# Patient Record
Sex: Female | Born: 1975 | Race: Black or African American | Hispanic: No | Marital: Single | Smoking: Current every day smoker
Health system: Southern US, Community
[De-identification: ages and names within clinical notes are randomized; demographics above are authoritative.]

## PROBLEM LIST (undated history)

## (undated) DIAGNOSIS — I8290 Acute embolism and thrombosis of unspecified vein: Secondary | ICD-10-CM

## (undated) HISTORY — PX: UNILATERAL SALPINGECTOMY: SHX6160

## (undated) HISTORY — PX: TUBAL LIGATION: SHX77

## (undated) HISTORY — DX: Acute embolism and thrombosis of unspecified vein: I82.90

---

## 1997-03-07 HISTORY — PX: KNEE SURGERY: SHX244

## 2006-10-01 ENCOUNTER — Emergency Department: Payer: Self-pay | Admitting: Emergency Medicine

## 2006-10-02 ENCOUNTER — Ambulatory Visit: Payer: Self-pay | Admitting: Unknown Physician Specialty

## 2011-11-19 ENCOUNTER — Ambulatory Visit: Payer: Self-pay

## 2017-12-14 ENCOUNTER — Other Ambulatory Visit: Payer: Self-pay

## 2017-12-14 ENCOUNTER — Emergency Department
Admission: EM | Admit: 2017-12-14 | Discharge: 2017-12-14 | Disposition: A | Discharge status: Home / Self Care | Payer: Self-pay | Attending: Emergency Medicine | Admitting: Emergency Medicine

## 2017-12-14 DIAGNOSIS — F17210 Nicotine dependence, cigarettes, uncomplicated: Secondary | ICD-10-CM | POA: Insufficient documentation

## 2017-12-14 DIAGNOSIS — R102 Pelvic and perineal pain: Secondary | ICD-10-CM

## 2017-12-14 DIAGNOSIS — N3001 Acute cystitis with hematuria: Secondary | ICD-10-CM | POA: Insufficient documentation

## 2017-12-14 LAB — URINALYSIS, COMPLETE (UACMP) WITH MICROSCOPIC
BILIRUBIN URINE: NEGATIVE
Glucose, UA: NEGATIVE mg/dL
Hgb urine dipstick: NEGATIVE
KETONES UR: 20 mg/dL — AB
Leukocytes, UA: NEGATIVE
Nitrite: POSITIVE — AB
PH: 6 (ref 5.0–8.0)
Protein, ur: NEGATIVE mg/dL
Specific Gravity, Urine: 1.021 (ref 1.005–1.030)

## 2017-12-14 LAB — CBC
HEMATOCRIT: 41.2 % (ref 36.0–46.0)
HEMOGLOBIN: 13.2 g/dL (ref 12.0–15.0)
MCH: 28.8 pg (ref 26.0–34.0)
MCHC: 32 g/dL (ref 30.0–36.0)
MCV: 90 fL (ref 80.0–100.0)
Platelets: 182 10*3/uL (ref 150–400)
RBC: 4.58 MIL/uL (ref 3.87–5.11)
RDW: 13.3 % (ref 11.5–15.5)
WBC: 10.2 10*3/uL (ref 4.0–10.5)
nRBC: 0 % (ref 0.0–0.2)

## 2017-12-14 LAB — POCT PREGNANCY, URINE: PREG TEST UR: NEGATIVE

## 2017-12-14 MED ORDER — KETOROLAC TROMETHAMINE 30 MG/ML IJ SOLN: 30.0000 mg | Freq: Once | INTRAMUSCULAR | Status: DC

## 2017-12-14 MED ORDER — KETOROLAC TROMETHAMINE 30 MG/ML IJ SOLN
15.0000 mg | Freq: Once | INTRAMUSCULAR | Status: DC
  Filled 2017-12-14: qty 1

## 2017-12-14 MED ORDER — NITROFURANTOIN MONOHYD MACRO 100 MG PO CAPS: 100.0000 mg | ORAL_CAPSULE | Freq: Once | ORAL | Status: DC

## 2017-12-14 MED ORDER — KETOROLAC TROMETHAMINE 30 MG/ML IJ SOLN
15.0000 mg | Freq: Once | INTRAMUSCULAR | Status: AC
  Administered 2017-12-14: 15 mg via INTRAMUSCULAR

## 2017-12-14 MED ORDER — NITROFURANTOIN MONOHYD MACRO 100 MG PO CAPS
100.0000 mg | ORAL_CAPSULE | Freq: Once | ORAL | Status: AC
  Administered 2017-12-14: 100 mg via ORAL
  Filled 2017-12-14: qty 1

## 2017-12-14 MED ORDER — KETOROLAC TROMETHAMINE 60 MG/2ML IM SOLN: 60.0000 mg | Freq: Once | INTRAMUSCULAR | Status: DC

## 2017-12-14 MED ORDER — NITROFURANTOIN MONOHYD MACRO 100 MG PO CAPS: 100.0000 mg | ORAL_CAPSULE | Freq: Two times a day (BID) | ORAL | 0 refills | Status: AC

## 2017-12-14 NOTE — ED Provider Notes (Signed)
[facility] Emergency Department Provider Note  ____________________________________________  Time seen: Approximately 7:02 PM  I have reviewed the triage vital signs and the nursing notes.   HISTORY  Chief Complaint Abdominal Pain   HPI Jill Blair is a 42 y.o. female no significant past medical history who presents for evaluation of abdominal pain.  Patient reports that a week ago she had several days of nausea, vomiting, diarrhea and abdominal pain.  Those symptoms resolved.  Over the last few days she has had sharp suprapubic pain that she describes as similar to uterine contractions, intermittent and associated with pressure with urination.  She describes decreased urination but has not been drinking lots of fluid.  She denies flank pain, fever, but has had chills. No nausea or vomiting, no diarrhea, no vaginal discharge.  Patient has had a tubal ligation but no other abdominal surgeries  PMH None - reviewed  Allergies Patient has no allergy information on record.    Social History Social History   Tobacco Use  . Smoking status: Current Every Day Smoker    Packs/day: 0.50    Types: Cigarettes  . Smokeless tobacco: Never Used  Substance Use Topics  . Alcohol use: Yes    Comment: occasionally  . Drug use: Never    Review of Systems  Constitutional: Negative for fever. Eyes: Negative for visual changes. ENT: Negative for sore throat. Neck: No neck pain  Cardiovascular: Negative for chest pain. Respiratory: Negative for shortness of breath. Gastrointestinal: + suprapubic abdominal pain. No vomiting or diarrhea. Genitourinary: Negative for dysuria. Musculoskeletal: Negative for back pain. Skin: Negative for rash. Neurological: Negative for headaches, weakness or numbness. Psych: No SI or HI  ____________________________________________   PHYSICAL EXAM:  VITAL SIGNS: ED Triage Vitals  Enc Vitals Group     BP 12/14/17 1530  121/87     Pulse Rate 12/14/17 1530 76     Resp 12/14/17 1530 16     Temp 12/14/17 1530 99.2 F (37.3 C)     Temp Source 12/14/17 1530 Oral     SpO2 12/14/17 1530 98 %     Weight 12/14/17 1534 277 lb (125.6 kg)     Height 12/14/17 1530 5\' 4"  (1.626 m)     Head Circumference --      Peak Flow --      Pain Score 12/14/17 1534 9     Pain Loc --      Pain Edu? --      Excl. in GC? --     Constitutional: Alert and oriented. Well appearing and in no apparent distress. HEENT:      Head: Normocephalic and atraumatic.         Eyes: Conjunctivae are normal. Sclera is non-icteric.       Mouth/Throat: Mucous membranes are moist.       Neck: Supple with no signs of meningismus. Cardiovascular: Regular rate and rhythm. No murmurs, gallops, or rubs. 2+ symmetrical distal pulses are present in all extremities. No JVD. Respiratory: Normal respiratory effort. Lungs are clear to auscultation bilaterally. No wheezes, crackles, or rhonchi.  Gastrointestinal: Soft, tender to palpation over the suprapubic region, and non distended with positive bowel sounds. No rebound or guarding. Genitourinary: No CVA tenderness. Musculoskeletal: Nontender with normal range of motion in all extremities. No edema, cyanosis, or erythema of extremities. Neurologic: Normal speech and language. Face is symmetric. Moving all extremities. No gross focal neurologic deficits are appreciated. Skin: Skin is warm, dry and  intact. No rash noted. Psychiatric: Mood and affect are normal. Speech and behavior are normal.  ____________________________________________   LABS (all labs ordered are listed, but only abnormal results are displayed)  Labs Reviewed  URINALYSIS, COMPLETE (UACMP) WITH MICROSCOPIC - Abnormal; Notable for the following components:      Result Value   Color, Urine YELLOW (*)    APPearance HAZY (*)    Ketones, ur 20 (*)    Nitrite POSITIVE (*)    Bacteria, UA MANY (*)    All other components within normal  limits  URINE CULTURE  CBC  COMPREHENSIVE METABOLIC PANEL  LIPASE, BLOOD  POC URINE PREG, ED  POCT PREGNANCY, URINE   ____________________________________________  EKG  none  ____________________________________________  RADIOLOGY  none  ____________________________________________   PROCEDURES  Procedure(s) performed: None Procedures Critical Care performed:  None ____________________________________________   INITIAL IMPRESSION / ASSESSMENT AND PLAN / ED COURSE  42 y.o. female no significant past medical history who presents for evaluation of suprapubic abdominal pain for several days.  Patient is well-appearing and in no distress, she has normal vital signs, temp of 99.2 in triage but repeat during my exam 98.28F.  Abdomen is soft with tenderness palpation of the suprapubic region.  UA positive for UTI.  Labs pending.  No flank pain or signs of sepsis or pyelonephritis at this time. Clinical Course as of Dec 14 2029  Thu Dec 14, 2017  2029 CBC with no leukocytosis.  CMP was clotted and I recommended repeating however patient wants to go home since she is been here for 5 hours and she has 2 kids at home.  Recommend close follow-up with primary care doctor for recheck of her labs.  Discussed return precautions for pyelonephritis and sepsis.  Patient will be discharged home on Macrobid.   [CV]    Clinical Course User Index [CV] Don Perking [place], MD     As part of my medical decision making, I reviewed the following data within the electronic MEDICAL RECORD NUMBER Nursing notes reviewed and incorporated, Labs reviewed , Old chart reviewed, Notes from prior ED visits and [place] Controlled Substance Database    Pertinent labs & imaging results that were available during my care of the patient were reviewed by me and considered in my medical decision making (see chart for details).    ____________________________________________   FINAL CLINICAL IMPRESSION(S) / ED  DIAGNOSES  Final diagnoses:  Acute cystitis with hematuria  Suprapubic abdominal pain      NEW MEDICATIONS STARTED DURING THIS VISIT:  ED Discharge Orders    None       Note:  This document was prepared using Dragon voice recognition software and may include unintentional dictation errors.    Nita Sickle, MD 12/14/17 2031

## 2017-12-14 NOTE — Discharge Instructions (Addendum)

## 2017-12-14 NOTE — ED Notes (Addendum)
This RN attempted IV without success. Pt requesting pain medication as an IM shot instead of a second IV attempt. MD made aware.

## 2017-12-14 NOTE — ED Triage Notes (Signed)
Pt to the er for lower abd pain and pressure. Pt reports less urination.

## 2017-12-16 LAB — URINE CULTURE

## 2019-08-29 ENCOUNTER — Encounter: Payer: Self-pay | Admitting: Obstetrics and Gynecology

## 2019-08-29 ENCOUNTER — Other Ambulatory Visit: Payer: Self-pay

## 2019-08-29 ENCOUNTER — Encounter: Payer: Self-pay | Admitting: Radiology

## 2019-08-29 ENCOUNTER — Ambulatory Visit (INDEPENDENT_AMBULATORY_CARE_PROVIDER_SITE_OTHER): Payer: Managed Care, Other (non HMO) | Admitting: Obstetrics and Gynecology

## 2019-08-29 VITALS — BP 132/88 | HR 72 | Wt 278.8 lb

## 2019-08-29 DIAGNOSIS — Z3009 Encounter for other general counseling and advice on contraception: Secondary | ICD-10-CM | POA: Diagnosis not present

## 2019-08-29 DIAGNOSIS — R7989 Other specified abnormal findings of blood chemistry: Secondary | ICD-10-CM

## 2019-08-29 DIAGNOSIS — R030 Elevated blood-pressure reading, without diagnosis of hypertension: Secondary | ICD-10-CM

## 2019-08-29 DIAGNOSIS — Z01419 Encounter for gynecological examination (general) (routine) without abnormal findings: Secondary | ICD-10-CM

## 2019-08-29 DIAGNOSIS — Z8759 Personal history of other complications of pregnancy, childbirth and the puerperium: Secondary | ICD-10-CM | POA: Diagnosis not present

## 2019-08-29 DIAGNOSIS — Z6841 Body Mass Index (BMI) 40.0 and over, adult: Secondary | ICD-10-CM | POA: Diagnosis not present

## 2019-08-29 DIAGNOSIS — Z86718 Personal history of other venous thrombosis and embolism: Secondary | ICD-10-CM

## 2019-08-29 DIAGNOSIS — Z720 Tobacco use: Secondary | ICD-10-CM | POA: Insufficient documentation

## 2019-08-29 NOTE — Progress Notes (Signed)
Patient reports having abnormal periods for the past couple months. Pap today 2017 STI-testing  Hiv and RPR  Mother breast cancer over 59

## 2019-08-29 NOTE — Progress Notes (Signed)
Obstetrics and Gynecology Annual Patient Evaluation  Appointment Date: 08/29/2019  OBGYN Clinic: [facility] [place]  Primary Care Provider: None  Chief Complaint:  Chief Complaint  Patient presents with  . Gynecologic Exam    History of Present Illness: Jill Blair is a 44 y.o. African-American [phone] (Patient's last menstrual period was 08/21/2019.), seen for the above chief complaint.  Patient with periods that are bordering on q3wks for the past few month, still approximately a week. No climateric s/s.     Review of Systems: As Per HPI otherwise negative  Patient Active Problem List   Diagnosis Date Noted  . History of ectopic pregnancy 08/29/2019  . Elevated blood pressure reading 08/29/2019  . BMI 40.0-44.9, adult ([place]) 08/29/2019    Past Medical History:  Past Medical History:  Diagnosis Date  . Blood clot in vein    estrogen related?     Past Surgical History:  Past Surgical History:  Procedure Laterality Date  . KNEE SURGERY  1999  . TUBAL LIGATION     failed. pt with subsequent left tubal pregnancy in early 2010s  . UNILATERAL SALPINGECTOMY Right    ectopic pregnacy    Past Obstetrical History:  OB History  Gravida Para Term Preterm AB Living  5 3 2   2 2   SAB TAB Ectopic Multiple Live Births      2   2    # Outcome Date GA Lbr Len/2nd Weight Sex Delivery Anes PTL Lv  5 Term 12/31/08     Vag-Spont  N LIV  4 Para 05/06/02     Vag-Spont  N   3 Term           2 Ectopic           1 Ectopic             Obstetric Comments  G1: right ectopic   G2 and G3: SVDs  Left tubal ligation  G4: left tubal pregnancy. Pt states treated w/o surgery or medications   Past Gynecological History: As per HPI.  Social History:  Social History   Socioeconomic History  . Marital status: Single    Spouse name: Not on file  . Number of children: Not on file  . Years of education: Not on file  . Highest education level: Not on file   Occupational History  . Not on file  Tobacco Use  . Smoking status: Current Every Day Smoker    Packs/day: 0.50    Types: Cigarettes  . Smokeless tobacco: Never Used  Substance and Sexual Activity  . Alcohol use: Yes    Comment: occasionally  . Drug use: Never  . Sexual activity: Yes    Birth control/protection: Surgical  Other Topics Concern  . Not on file  Social History Narrative  . Not on file   Social Determinants of Health   Financial Resource Strain:   . Difficulty of Paying Living Expenses:   Food Insecurity:   . Worried About Charity fundraiser in the Last Year:   . Arboriculturist in the Last Year:   Transportation Needs:   . Film/video editor (Medical):   Marland Kitchen Lack of Transportation (Non-Medical):   Physical Activity:   . Days of Exercise per Week:   . Minutes of Exercise per Session:   Stress:   . Feeling of Stress :   Social Connections:   . Frequency of Communication with Friends and Family:   .  Frequency of Social Gatherings with Friends and Family:   . Attends Religious Services:   . Active Member of Clubs or Organizations:   . Attends Banker Meetings:   Marland Kitchen Marital Status:   Intimate Partner Violence:   . Fear of Current or Ex-Partner:   . Emotionally Abused:   Marland Kitchen Physically Abused:   . Sexually Abused:     Family History:  Family History  Problem Relation Age of Onset  . Cancer Mother   . Cancer Father    Mother with Breast cancer in her 74s Father with skin cancer  Medications [name] had no medications administered during this visit. No current outpatient medications on file.   No current facility-administered medications for this visit.    Allergies Patient has no known allergies.   Physical Exam:  BP 132/88   Pulse 72   Wt 278 lb 12.8 oz (126.5 kg)   LMP 08/21/2019 Comment: Tubal ligation 2011  BMI 47.86 kg/m  Body mass index is 47.86 kg/m. General appearance: Well nourished, well developed  female in no acute distress.  Neck:  Supple, normal appearance, and no thyromegaly  Cardiovascular: normal s1 and s2.  No murmurs, rubs or gallops. Respiratory:  Clear to auscultation bilateral. Normal respiratory effort Abdomen: positive bowel sounds and no masses, hernias; diffusely non tender to palpation, non distended Breasts: breasts appear normal, no suspicious masses, no skin or nipple changes or axillary nodes, and normal palpation. Neuro/Psych:  Normal mood and affect.  Skin:  Warm and dry.  Lymphatic:  No inguinal lymphadenopathy.   Pelvic exam: is not limited by body habitus EGBUS: within normal limits Vagina: within normal limits and with no blood or discharge in the vault Cervix: normal appearing cervix without tenderness, discharge or lesions. Uterus:  nonenlarged and non tender Adnexa:  normal adnexa and no mass, fullness, tenderness Rectovaginal: deferred  Laboratory: none  Radiology: none  Assessment: pt stable  Plan:  1. Well woman exam with routine gynecological exam Shortened cycle likely age related. Will check basic labs as patient does not have a pcp. List given to patient. I told her that since she got pregnant after having a tubal that I wouldn't consider it effective anymore, even though it's been approx. Ten years. Pt declines additional intervention for this.  - HIV Antibody (routine testing w rflx) - RPR - Hepatitis C Antibody - MM 3D SCREEN BREAST BILATERAL; Future - Lipid panel - Hemoglobin A1c - TSH - Comprehensive metabolic panel - CBC - Hepatitis B Surface AntiGEN - VITAMIN D 25 Hydroxy (Vit-D Deficiency, Fractures) - Chlamydia/Gonococcus/Trichomonas, NAA - IGP, Aptima HPV  2. History of ectopic pregnancy  3. BMI 40.0-44.9, adult ([facility])  4. Elevated blood pressure reading D/w her will need to follow this up at next visits.   Orders Placed This Encounter  Procedures  . Chlamydia/Gonococcus/Trichomonas, NAA  . MM 3D SCREEN BREAST  BILATERAL  . HIV Antibody (routine testing w rflx)  . RPR  . Hepatitis C Antibody  . Lipid panel  . Hemoglobin A1c  . TSH  . Comprehensive metabolic panel  . CBC  . Hepatitis B Surface AntiGEN  . VITAMIN D 25 Hydroxy (Vit-D Deficiency, Fractures)    RTC PRN  Cornelia Copa MD Attending Center [facility])

## 2019-08-30 LAB — HEPATITIS C ANTIBODY: Hep C Virus Ab: <0.1 s/co ratio (ref 0.0–0.9)

## 2019-08-30 LAB — LIPID PANEL
Chol/HDL Ratio: 4.8 ratio — ABNORMAL HIGH (ref 0.0–4.4)
Cholesterol, Total: 181 mg/dL (ref 100–199)
HDL: 38 mg/dL — ABNORMAL LOW (ref >39)
LDL Chol Calc (NIH): 119 mg/dL — ABNORMAL HIGH (ref 0–99)
Triglycerides: 131 mg/dL (ref 0–149)
VLDL Cholesterol Cal: 24 mg/dL (ref 5–40)

## 2019-08-30 LAB — TSH: TSH: 2.72 u[IU]/mL (ref 0.450–4.500)

## 2019-08-30 LAB — COMPREHENSIVE METABOLIC PANEL
ALT: 8 IU/L (ref 0–32)
AST: 13 IU/L (ref 0–40)
Albumin/Globulin Ratio: 1.3 (ref 1.2–2.2)
Albumin: 4.2 g/dL (ref 3.8–4.8)
Alkaline Phosphatase: 78 IU/L (ref 48–121)
BUN/Creatinine Ratio: 11 (ref 9–23)
BUN: 11 mg/dL (ref 6–24)
Bilirubin Total: 0.4 mg/dL (ref 0.0–1.2)
CO2: 23 mmol/L (ref 20–29)
Calcium: 8.9 mg/dL (ref 8.7–10.2)
Chloride: 103 mmol/L (ref 96–106)
Creatinine, Ser: 1.02 mg/dL — ABNORMAL HIGH (ref 0.57–1.00)
GFR calc Af Amer: 79 mL/min/{1.73_m2} (ref >59)
GFR calc non Af Amer: 68 mL/min/{1.73_m2} (ref >59)
Globulin, Total: 3.3 g/dL (ref 1.5–4.5)
Glucose: 91 mg/dL (ref 65–99)
Potassium: 4.3 mmol/L (ref 3.5–5.2)
Sodium: 136 mmol/L (ref 134–144)
Total Protein: 7.5 g/dL (ref 6.0–8.5)

## 2019-08-30 LAB — CBC
Hematocrit: 42.3 % (ref 34.0–46.6)
Hemoglobin: 13.4 g/dL (ref 11.1–15.9)
MCH: 29.3 pg (ref 26.6–33.0)
MCHC: 31.7 g/dL (ref 31.5–35.7)
MCV: 92 fL (ref 79–97)
Platelets: 359 10*3/uL (ref 150–450)
RBC: 4.58 x10E6/uL (ref 3.77–5.28)
RDW: 12.3 % (ref 11.7–15.4)
WBC: 7.5 10*3/uL (ref 3.4–10.8)

## 2019-08-30 LAB — VITAMIN D 25 HYDROXY (VIT D DEFICIENCY, FRACTURES): Vit D, 25-Hydroxy: 7.9 ng/mL — ABNORMAL LOW (ref 30.0–100.0)

## 2019-08-30 LAB — RPR: RPR Ser Ql: NONREACTIVE

## 2019-08-30 LAB — HEMOGLOBIN A1C
Est. average glucose Bld gHb Est-mCnc: 85 mg/dL
Hgb A1c MFr Bld: 4.6 % — ABNORMAL LOW (ref 4.8–5.6)

## 2019-08-30 LAB — HEPATITIS B SURFACE ANTIGEN: Hepatitis B Surface Ag: NEGATIVE

## 2019-08-30 LAB — HIV ANTIBODY (ROUTINE TESTING W REFLEX): HIV Screen 4th Generation wRfx: NONREACTIVE

## 2019-09-01 LAB — IGP, APTIMA HPV
HPV Aptima: NEGATIVE
PAP Smear Comment: 0

## 2019-09-02 ENCOUNTER — Telehealth: Payer: Self-pay | Admitting: *Deleted

## 2019-09-02 DIAGNOSIS — Z309 Encounter for contraceptive management, unspecified: Secondary | ICD-10-CM | POA: Insufficient documentation

## 2019-09-02 DIAGNOSIS — R7989 Other specified abnormal findings of blood chemistry: Secondary | ICD-10-CM | POA: Insufficient documentation

## 2019-09-02 LAB — CHLAMYDIA/GONOCOCCUS/TRICHOMONAS, NAA
Chlamydia by NAA: NEGATIVE
Gonococcus by NAA: NEGATIVE
Trich vag by NAA: NEGATIVE

## 2019-09-02 MED ORDER — VITAMIN D (ERGOCALCIFEROL) 1.25 MG (50000 UNIT) PO CAPS: 50000.0000 [IU] | ORAL_CAPSULE | ORAL | 0 refills | Status: AC

## 2019-09-02 NOTE — Telephone Encounter (Signed)
Pt informed of lab results and recommendations from Dr Vergie Living

## 2019-09-02 NOTE — Addendum Note (Signed)
Addended by: [place] Bing on: 09/02/2019 10:29 AM   Modules accepted: Orders

## 2019-09-02 NOTE — Telephone Encounter (Signed)
-----   Message from [place] Bing, MD sent at 09/02/2019 10:28 AM EDT ----- Can you let her know everything came back fine except your vitamin d is low, her cholesterol is a little high and her Cr is borderline high. I sent in pills for the low vitamin d, I recommend diet and exercise for her cholesterol and I recommend rechecking her Cr in 3 months. We also gave her a list of PCPs and remind her to call and establish care with one of them. Thanks!

## 2019-09-12 ENCOUNTER — Ambulatory Visit
Admission: RE | Admit: 2019-09-12 | Discharge: 2019-09-12 | Disposition: A | Discharge status: Home / Self Care | Payer: Managed Care, Other (non HMO) | Source: Ambulatory Visit | Attending: Obstetrics and Gynecology | Admitting: Obstetrics and Gynecology

## 2019-09-12 ENCOUNTER — Other Ambulatory Visit: Payer: Self-pay | Admitting: Obstetrics and Gynecology

## 2019-09-12 ENCOUNTER — Other Ambulatory Visit: Payer: Self-pay

## 2019-09-12 DIAGNOSIS — Z01419 Encounter for gynecological examination (general) (routine) without abnormal findings: Secondary | ICD-10-CM

## 2019-09-12 DIAGNOSIS — Z1231 Encounter for screening mammogram for malignant neoplasm of breast: Secondary | ICD-10-CM

## 2019-09-12 DIAGNOSIS — N63 Unspecified lump in unspecified breast: Secondary | ICD-10-CM

## 2019-09-12 NOTE — Addendum Note (Signed)
Addended by: [place] Bing on: 09/12/2019 10:08 AM   Modules accepted: Orders

## 2019-09-13 LAB — URINALYSIS, ROUTINE W REFLEX MICROSCOPIC
Bilirubin, UA: NEGATIVE
Glucose, UA: NEGATIVE
Ketones, UA: NEGATIVE
Leukocytes,UA: NEGATIVE
Nitrite, UA: NEGATIVE
Protein,UA: NEGATIVE
RBC, UA: NEGATIVE
Specific Gravity, UA: 1.024 (ref 1.005–1.030)
Urobilinogen, Ur: 0.2 mg/dL (ref 0.2–1.0)
pH, UA: 6 (ref 5.0–7.5)

## 2021-01-22 IMAGING — MG DIGITAL SCREENING BILAT W/ TOMO W/ CAD
8 of 19 series · 8 of 40 positions shown · non-contrast
Comparison: None.

CLINICAL DATA: Screening.

EXAM:
DIGITAL SCREENING BILATERAL MAMMOGRAM WITH TOMO AND CAD

[L MLO synth-2D (1 of 2)]
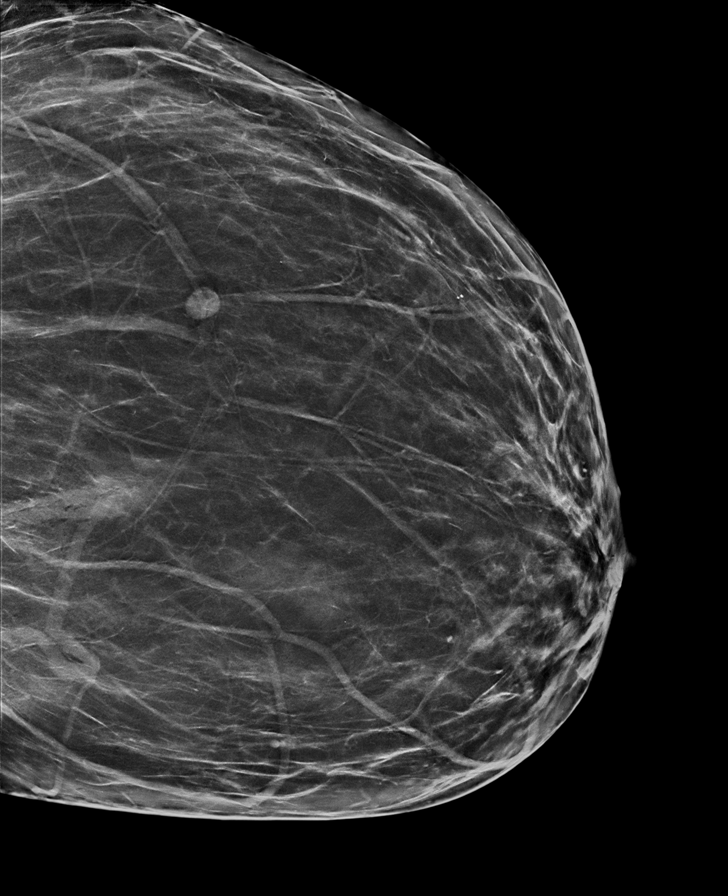

[R MLO synth-2D (1 of 2)]
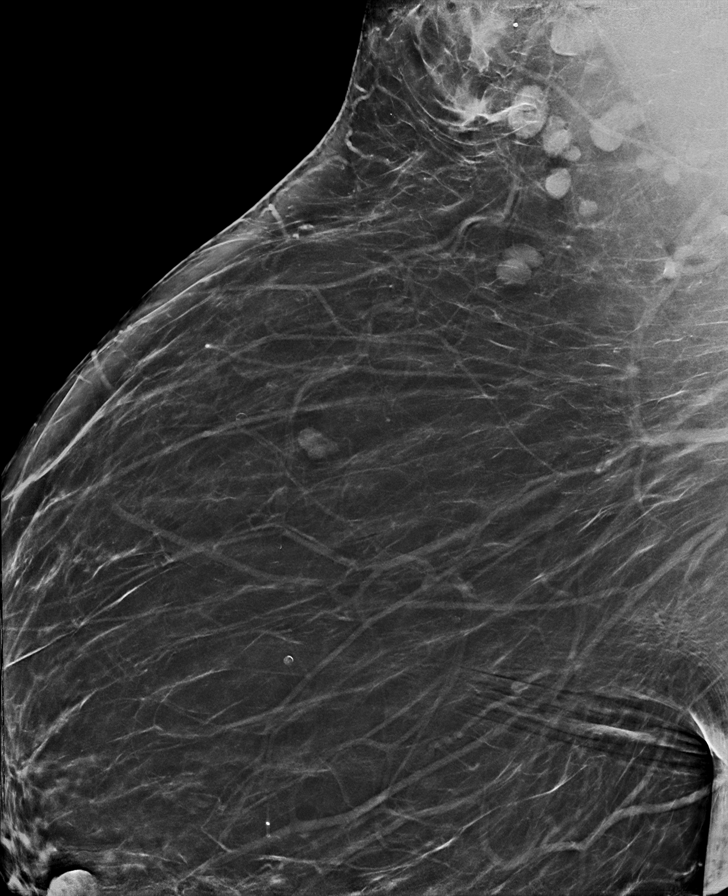

[L MLO synth-2D (2 of 2)]
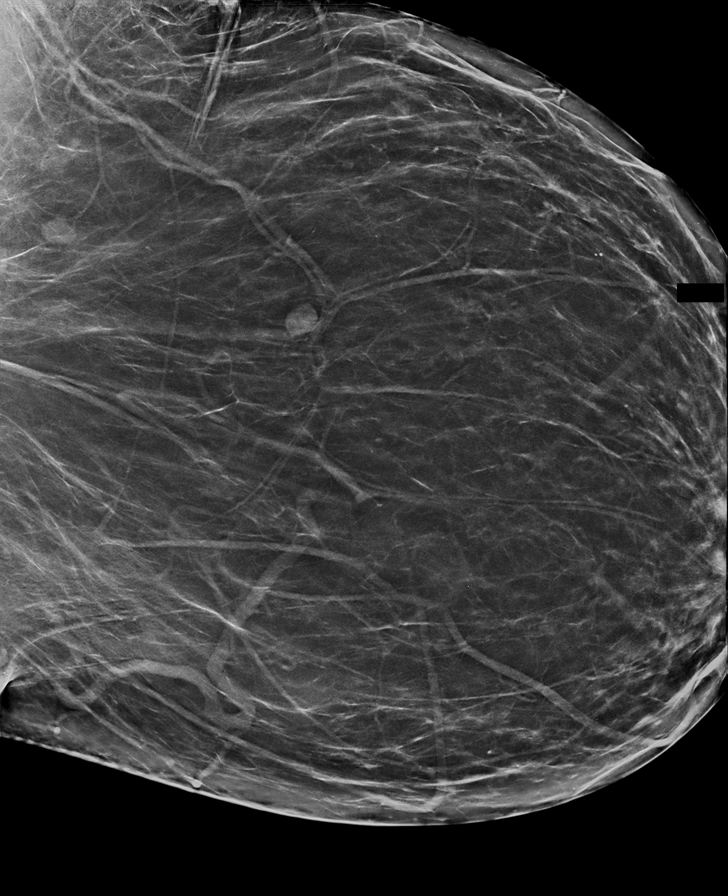

[R CC synth-2D (1 of 2)]
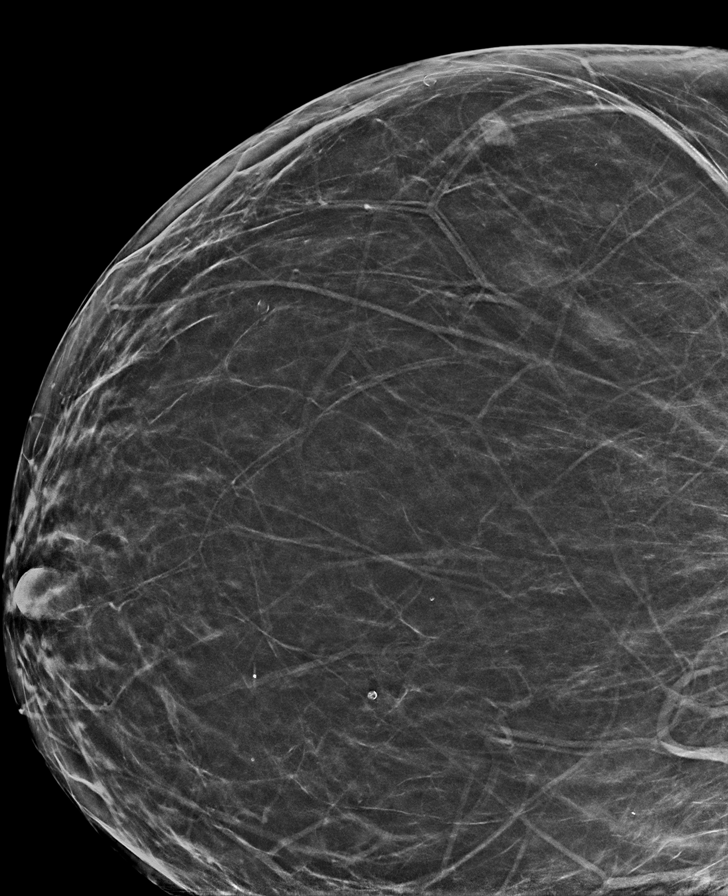

[L CC synth-2D]
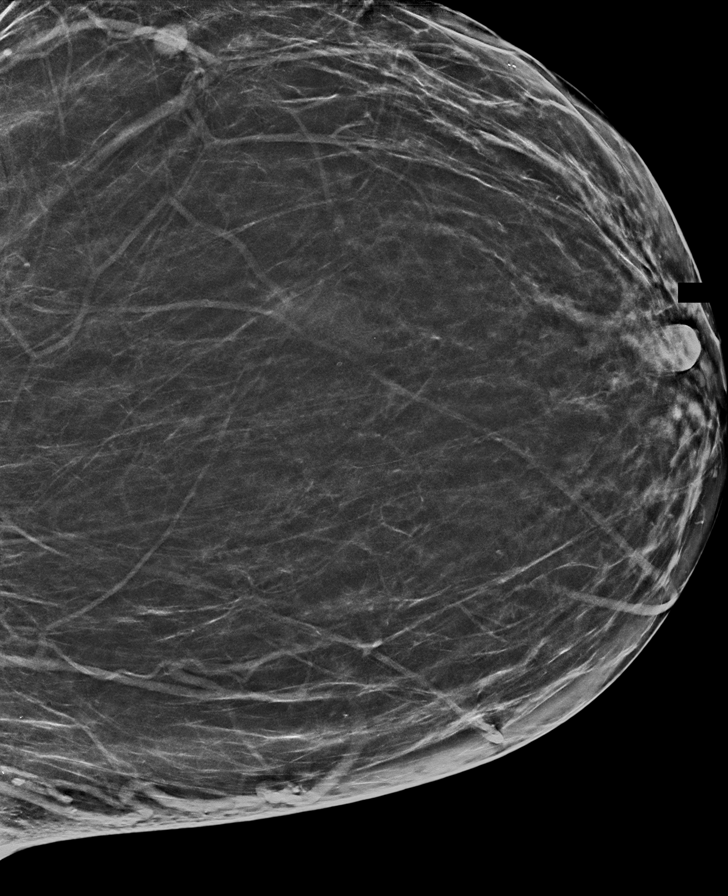

[R CC synth-2D (2 of 2)]
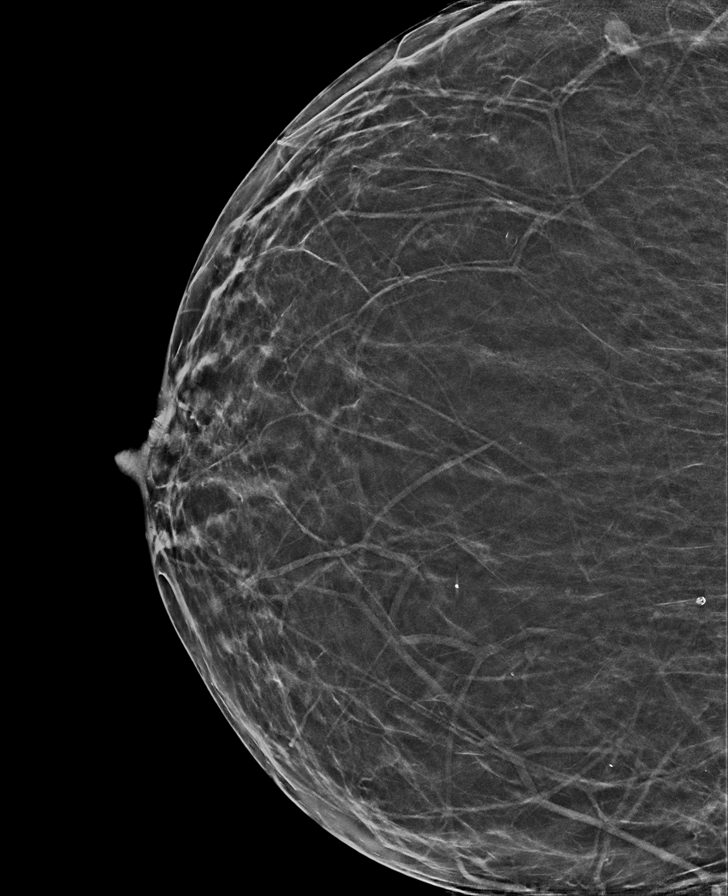

[R MLO synth-2D (2 of 2)]
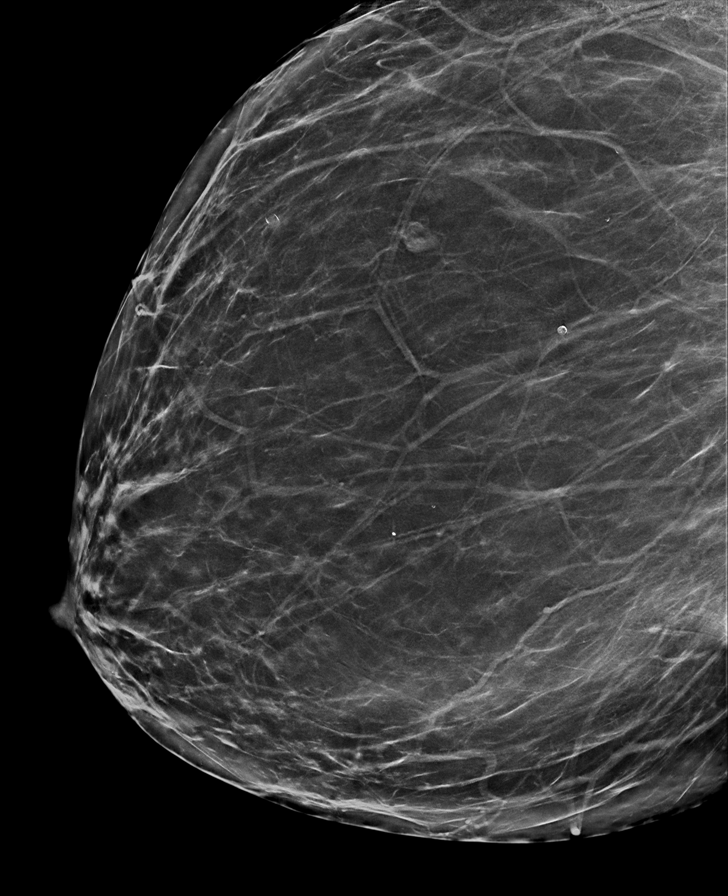

[L CC tomo · tomo slice 48/70.0]
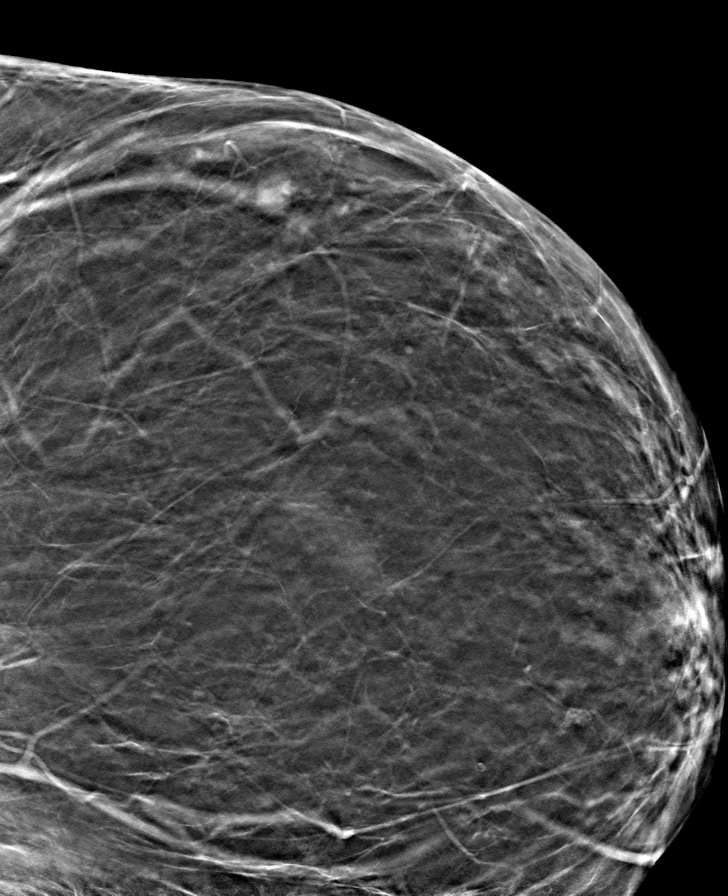

[8 of 40 positions shown; findings below may reference images not displayed]

ACR Breast Density Category b: There are scattered areas of
fibroglandular density.
FINDINGS: There are no findings suspicious for malignancy. Images were
processed with CAD.
IMPRESSION: No mammographic evidence of malignancy. A result letter of this
screening mammogram will be mailed directly to the patient.

RECOMMENDATION:
Screening mammogram in one year. (Code:Y5-G-EJ6)

BI-RADS CATEGORY  1: Negative.

## 2021-07-08 ENCOUNTER — Ambulatory Visit: Payer: Managed Care, Other (non HMO) | Admitting: Physician Assistant
